# Patient Record
Sex: Male | Born: 1985 | Race: Black or African American | Hispanic: No | Marital: Single | Smoking: Never smoker
Health system: Southern US, Community
[De-identification: ages and names within clinical notes are randomized; demographics above are authoritative.]

---
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
## 2004-09-17 ENCOUNTER — Emergency Department (HOSPITAL_COMMUNITY): Admission: EM | Admit: 2004-09-17 | Discharge: 2004-09-17 | Payer: Self-pay | Admitting: Family Medicine

## 2005-05-17 ENCOUNTER — Emergency Department (HOSPITAL_COMMUNITY): Admission: EM | Admit: 2005-05-17 | Discharge: 2005-05-18 | Payer: Self-pay | Admitting: Emergency Medicine

## 2005-05-18 ENCOUNTER — Ambulatory Visit (HOSPITAL_COMMUNITY): Admission: RE | Admit: 2005-05-18 | Discharge: 2005-05-18 | Payer: Self-pay | Admitting: Orthopaedic Surgery

## 2006-03-29 ENCOUNTER — Emergency Department (HOSPITAL_COMMUNITY): Admission: EM | Admit: 2006-03-29 | Discharge: 2006-03-29 | Payer: Self-pay | Admitting: Emergency Medicine

## 2006-04-01 ENCOUNTER — Emergency Department (HOSPITAL_COMMUNITY): Admission: EM | Admit: 2006-04-01 | Discharge: 2006-04-01 | Payer: Self-pay | Admitting: Emergency Medicine

## 2007-03-17 ENCOUNTER — Emergency Department (HOSPITAL_COMMUNITY): Admission: AC | Admit: 2007-03-17 | Discharge: 2007-03-17 | Payer: Self-pay

## 2008-02-21 ENCOUNTER — Ambulatory Visit: Payer: Self-pay | Admitting: Diagnostic Radiology

## 2008-02-21 ENCOUNTER — Emergency Department (HOSPITAL_BASED_OUTPATIENT_CLINIC_OR_DEPARTMENT_OTHER): Admission: EM | Admit: 2008-02-21 | Discharge: 2008-02-21 | Payer: Self-pay | Admitting: Emergency Medicine

## 2008-02-23 ENCOUNTER — Emergency Department (HOSPITAL_COMMUNITY): Admission: EM | Admit: 2008-02-23 | Discharge: 2008-02-23 | Payer: Self-pay | Admitting: Emergency Medicine
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
## 2010-11-05 LAB — I-STAT 8, (EC8 V) (CONVERTED LAB)
Acid-base deficit: 1
Bicarbonate: 22.9
HCT: 50
Hemoglobin: 17
Operator id: 294501
Potassium: 3.8
Sodium: 139
TCO2: 24

## 2010-11-05 LAB — TYPE AND SCREEN

## 2010-11-05 LAB — ABO/RH: ABO/RH(D): B NEG

## 2011-06-21 ENCOUNTER — Telehealth: Payer: Self-pay

## 2011-06-21 NOTE — Telephone Encounter (Signed)
Wrong pt

## 2012-04-18 ENCOUNTER — Emergency Department (HOSPITAL_COMMUNITY)
Admission: EM | Admit: 2012-04-18 | Discharge: 2012-04-18 | Disposition: A | Discharge status: Home / Self Care | Payer: Self-pay | Attending: Emergency Medicine | Admitting: Emergency Medicine

## 2012-04-18 DIAGNOSIS — H9319 Tinnitus, unspecified ear: Secondary | ICD-10-CM | POA: Insufficient documentation

## 2012-04-18 DIAGNOSIS — H6121 Impacted cerumen, right ear: Secondary | ICD-10-CM

## 2012-04-18 DIAGNOSIS — M542 Cervicalgia: Secondary | ICD-10-CM | POA: Insufficient documentation

## 2012-04-18 DIAGNOSIS — R51 Headache: Secondary | ICD-10-CM | POA: Insufficient documentation

## 2012-04-18 DIAGNOSIS — H6120 Impacted cerumen, unspecified ear: Secondary | ICD-10-CM | POA: Insufficient documentation

## 2012-04-18 NOTE — ED Provider Notes (Signed)
History  This chart was scribed for non-physician practitioner working with Flint Melter, MD by Ardeen Jourdain, ED Scribe. This patient was seen in room WTR6/WTR6 and the patient's care was started at 2130.  CSN: [id number]  Arrival date & time 04/18/12  2115   First MD Initiated Contact with Patient 04/18/12 2130      Chief Complaint  Patient presents with  . Otalgia    Patient is a 27 y.o. male presenting with plugged ear sensation. The history is provided by the patient. No language interpreter was used.  Ear Fullness This is a new problem. The current episode started 2 days ago. The problem occurs constantly. The problem has been gradually worsening. Pertinent negatives include no chest pain, no abdominal pain, no headaches and no shortness of breath. Nothing aggravates the symptoms. Nothing relieves the symptoms. He has tried water (Ear wax drops) for the symptoms. The treatment provided mild relief.    BO TEICHER is a 27 y.o. male who presents to the Emergency Department complaining of gradually worsening right ear fullness that began 2 days ago. He has associated tinnitus, HA, neck pain and muffled hearing. He states the pain begins in his ear and will radiate down his neck.  He states it feels like there is something in his ear. He describes the feeling as pressure more than pain. He reports using OTC ear flush, flushed with water and used q-tips with no relief. He denies any sore throat, nausea, emesis, fever and chills as associated symptoms. He states he has had similar symptoms in the past.  No past medical history on file.  No past surgical history on file.  No family history on file.  History  Substance Use Topics  . Smoking status: Not on file  . Smokeless tobacco: Not on file  . Alcohol Use: Not on file      Review of Systems  Constitutional: Negative for fever.  HENT: Positive for ear pain. Negative for hearing loss, congestion and rhinorrhea.   Respiratory:  Negative for shortness of breath.   Cardiovascular: Negative for chest pain.  Gastrointestinal: Negative for nausea, vomiting, abdominal pain and diarrhea.  Neurological: Negative for headaches.  All other systems reviewed and are negative.    Allergies  Review of patient's allergies indicates not on file.  Home Medications   Current Outpatient Rx  Name  Route  Sig  Dispense  Refill  . Multiple Vitamin (MULTIVITAMIN WITH MINERALS) TABS   Oral   Take 1 tablet by mouth daily.         Marland Kitchen OVER THE COUNTER MEDICATION   Oral   Take 1 packet by mouth 4 (four) times a week. Nitric oxide supplement.           Triage Vitals: BP 141/84  Pulse 81  Temp(Src) 98.7 F (37.1 C) (Oral)  Resp 20  Wt 185 lb (83.915 kg)  SpO2 100%  Physical Exam  Nursing note and vitals reviewed. Constitutional: He is oriented to person, place, and time. He appears well-developed and well-nourished. No distress.  HENT:  Head: Normocephalic and atraumatic.  Right Ear: External ear normal.  Left Ear: External ear normal.  Bilateral TM not visible, total cerumen impaction bilaterally  Eyes: Conjunctivae and EOM are normal. Pupils are equal, round, and reactive to light.  Neck: Normal range of motion. Neck supple. No tracheal deviation present.  Cardiovascular: Normal rate.   Pulmonary/Chest: Effort normal. No stridor. No respiratory distress.  Abdominal: Soft. He exhibits  no distension.  Musculoskeletal: Normal range of motion. He exhibits no edema.  Neurological: He is alert and oriented to person, place, and time.  Skin: Skin is warm and dry. He is not diaphoretic.  Psychiatric: He has a normal mood and affect. His behavior is normal.    ED Course  Procedures (including critical care time)  DIAGNOSTIC STUDIES: Oxygen Saturation is 100% on room air, normal by my interpretation.    COORDINATION OF CARE:  9:46 PM: Discussed treatment plan with pt at bedside and pt agreed to plan.     Labs  Reviewed - No data to display No results found.  Patient reassessed after right ear was flushed and large amount of cerumen was removed. He states he feels significantly better. TM clear, light reflex present.   1. Cerumen impaction, right       MDM  Vitals are stable. Patient showed significant improvement after cerumen was removed. No concern for otitis media, otitis externa. Return precautions given. Instructions for home cerumen removal given. Counseled on not using qtips or anything smaller than his elbow in his ear.     I personally performed the services described in this documentation, which was scribed in my presence. The recorded information has been reviewed and is accurate.  Mora Bellman, PA-C 04/18/12 2319

## 2012-04-18 NOTE — ED Notes (Signed)
Patient with right ear pain and discomfort for two days.  Denies fever chills, nausea, or vomiting.  Denies sore throat or cold symptoms.  Patient said ear felt initially stopped up so he used some over the counter flush, then ear became painful early this morning.

## 2012-04-18 NOTE — ED Provider Notes (Signed)
Medical screening examination/treatment/procedure(s) were performed by non-physician practitioner and as supervising physician I was immediately available for consultation/collaboration.   Alvis Edgell L Itzel Mckibbin, MD 04/18/12 2353 

## 2019-09-18 ENCOUNTER — Emergency Department (HOSPITAL_COMMUNITY)
Admission: EM | Admit: 2019-09-18 | Discharge: 2019-09-18 | Disposition: A | Discharge status: Home / Self Care | Payer: Managed Care, Other (non HMO) | Attending: Emergency Medicine | Admitting: Emergency Medicine

## 2019-09-18 ENCOUNTER — Encounter (HOSPITAL_COMMUNITY): Payer: Self-pay | Admitting: Emergency Medicine

## 2019-09-18 ENCOUNTER — Other Ambulatory Visit: Payer: Self-pay

## 2019-09-18 ENCOUNTER — Emergency Department (HOSPITAL_COMMUNITY): Payer: Managed Care, Other (non HMO)

## 2019-09-18 DIAGNOSIS — Y929 Unspecified place or not applicable: Secondary | ICD-10-CM | POA: Diagnosis not present

## 2019-09-18 DIAGNOSIS — S80912A Unspecified superficial injury of left knee, initial encounter: Secondary | ICD-10-CM | POA: Diagnosis not present

## 2019-09-18 DIAGNOSIS — Y999 Unspecified external cause status: Secondary | ICD-10-CM | POA: Diagnosis not present

## 2019-09-18 DIAGNOSIS — S8992XA Unspecified injury of left lower leg, initial encounter: Secondary | ICD-10-CM

## 2019-09-18 DIAGNOSIS — Y939 Activity, unspecified: Secondary | ICD-10-CM | POA: Insufficient documentation

## 2019-09-18 MED ORDER — OXYCODONE-ACETAMINOPHEN 5-325 MG PO TABS: 1.0000 | ORAL_TABLET | Freq: Four times a day (QID) | ORAL | 0 refills | Status: AC | PRN

## 2019-09-18 MED ORDER — OXYCODONE-ACETAMINOPHEN 5-325 MG PO TABS
1.0000 | ORAL_TABLET | Freq: Once | ORAL | Status: AC
  Administered 2019-09-18: 1 via ORAL
  Filled 2019-09-18: qty 1

## 2019-09-18 MED ORDER — IBUPROFEN 600 MG PO TABS: 600.0000 mg | ORAL_TABLET | Freq: Four times a day (QID) | ORAL | 0 refills | Status: AC | PRN

## 2019-09-18 MED ORDER — KETOROLAC TROMETHAMINE 60 MG/2ML IM SOLN
30.0000 mg | Freq: Once | INTRAMUSCULAR | Status: AC
  Administered 2019-09-18: 30 mg via INTRAMUSCULAR
  Filled 2019-09-18: qty 2

## 2019-09-18 NOTE — Progress Notes (Signed)
Orthopedic Tech Progress Note Patient Details:  Gerald Coleman 07-Mar-1985 [id number]  Ortho Devices Type of Ortho Device: Knee Immobilizer, Crutches Ortho Device/Splint Location: left Ortho Device/Splint Interventions: Application   Post Interventions Patient Tolerated: Well Instructions Provided: Care of device   Saul Fordyce 09/18/2019, 9:53 AM

## 2019-09-18 NOTE — ED Notes (Signed)
Pt discharged from this ED in stable condition at this time. All discharge instructions and follow up care reviewed with pt with no further questions at this time. Pt ambulatory with crutches, clear speech.  

## 2019-09-18 NOTE — Discharge Instructions (Addendum)
Please read instructions below. Apply ice to your knee for 20 minutes at a time. Elevate your leg as much as possible to help with swelling. You can take oxycodone every 6 hours as needed for severe pain.  Be aware there is Tylenol in this medication.  It can also make you drowsy, do not drive or drink alcohol while taking it. Please take ibuprofen, 600 mg, every 6 hours to help with pain and swelling.  You can take the oxycodone in addition to this medication if needed. Schedule an appointment with the orthopedic specialist in 1 to 2 weeks for follow-up on your injury. Keep the knee brace on as much as possible, especially when you are mobile. Return to the ER for new or concerning symptoms.
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
## 2019-09-18 NOTE — ED Triage Notes (Signed)
Pt reports having left knee pain that occurred at 0300 and felt knee pop out and was then able to pop it back in place.

## 2020-12-15 ENCOUNTER — Ambulatory Visit
Admission: EM | Admit: 2020-12-15 | Discharge: 2020-12-15 | Disposition: A | Discharge status: Home / Self Care | Payer: Managed Care, Other (non HMO) | Attending: Internal Medicine | Admitting: Internal Medicine

## 2020-12-15 ENCOUNTER — Other Ambulatory Visit: Payer: Self-pay

## 2020-12-15 ENCOUNTER — Encounter: Payer: Self-pay | Admitting: Emergency Medicine

## 2020-12-15 DIAGNOSIS — J101 Influenza due to other identified influenza virus with other respiratory manifestations: Secondary | ICD-10-CM | POA: Diagnosis not present

## 2020-12-15 DIAGNOSIS — R509 Fever, unspecified: Secondary | ICD-10-CM

## 2020-12-15 LAB — POCT INFLUENZA A/B
Influenza A, POC: POSITIVE — AB
Influenza B, POC: NEGATIVE

## 2020-12-15 MED ORDER — ACETAMINOPHEN 325 MG PO TABS
650.0000 mg | ORAL_TABLET | Freq: Once | ORAL | Status: AC
  Administered 2020-12-15: 650 mg via ORAL

## 2020-12-15 MED ORDER — OSELTAMIVIR PHOSPHATE 75 MG PO CAPS: 75.0000 mg | ORAL_CAPSULE | Freq: Two times a day (BID) | ORAL | 0 refills | Status: AC

## 2020-12-15 NOTE — ED Provider Notes (Signed)
[facility]    CSN: [id number] Arrival date & time: 12/15/20  1209      History   Chief Complaint Chief Complaint  Patient presents with   Cough   Generalized Body Aches    HPI Gerald Coleman is a 35 y.o. male.   Patient presents with nonproductive cough, generalized body aches, fever that started yesterday.  Denies any upper respiratory symptoms, sore throat, ear pain.  Denies any known sick contacts.  Denies chest pain or shortness of breath.  Denies nausea, vomiting, diarrhea, abdominal pain.   Cough  History reviewed. No pertinent past medical history.  There are no problems to display for this patient.   History reviewed. No pertinent surgical history.     Home Medications    Prior to Admission medications   Medication Sig Start Date End Date Taking? Authorizing Provider  oseltamivir (TAMIFLU) 75 MG capsule Take 1 capsule (75 mg total) by mouth every 12 (twelve) hours. 12/15/20  Yes Magdalen Cabana, Rolly Salter E, FNP  ibuprofen (ADVIL) 600 MG tablet Take 1 tablet (600 mg total) by mouth every 6 (six) hours as needed. 09/18/19   Robinson, [place] N, PA-C  Multiple Vitamin (MULTIVITAMIN WITH MINERALS) TABS Take 1 tablet by mouth daily.    [provider]  OVER THE COUNTER MEDICATION Take 1 packet by mouth 4 (four) times a week. Nitric oxide supplement.    [provider]  oxyCODONE-acetaminophen (PERCOCET/ROXICET) 5-325 MG tablet Take 1 tablet by mouth every 6 (six) hours as needed for severe pain. 09/18/19   Robinson, [place] N, PA-C    Family History No family history on file.  Social History Social History   Tobacco Use   Smoking status: Never   Smokeless tobacco: Never  Substance Use Topics   Alcohol use: Never   Drug use: Never     Allergies   Patient has no known allergies.   Review of Systems Review of Systems Per HPI  Physical Exam Triage Vital Signs ED Triage Vitals [12/15/20 1334]  Enc Vitals Group     BP 129/82     Pulse  Rate 79     Resp 16     Temp (!) 100.4 F (38 C)     Temp Source Oral     SpO2 98 %     Weight      Height      Head Circumference      Peak Flow      Pain Score 7     Pain Loc      Pain Edu?      Excl. in GC?    No data found.  Updated Vital Signs BP 129/82 (BP Location: Left Arm)   Pulse 79   Temp (!) 100.4 F (38 C) (Oral)   Resp 16   SpO2 98%   Visual Acuity Right Eye Distance:   Left Eye Distance:   Bilateral Distance:    Right Eye Near:   Left Eye Near:    Bilateral Near:     Physical Exam Constitutional:      General: He is not in acute distress.    Appearance: Normal appearance. He is not toxic-appearing or diaphoretic.  HENT:     Head: Normocephalic and atraumatic.     Right Ear: Tympanic membrane and ear canal normal.     Left Ear: Tympanic membrane and ear canal normal.     Nose: Congestion present.     Mouth/Throat:     Mouth:  Mucous membranes are moist.     Pharynx: No posterior oropharyngeal erythema.  Eyes:     Extraocular Movements: Extraocular movements intact.     Conjunctiva/sclera: Conjunctivae normal.     Pupils: Pupils are equal, round, and reactive to light.  Cardiovascular:     Rate and Rhythm: Normal rate and regular rhythm.     Pulses: Normal pulses.     Heart sounds: Normal heart sounds.  Pulmonary:     Effort: Pulmonary effort is normal. No respiratory distress.     Breath sounds: Normal breath sounds. No stridor. No wheezing, rhonchi or rales.  Abdominal:     General: Abdomen is flat. Bowel sounds are normal.     Palpations: Abdomen is soft.  Musculoskeletal:        General: Normal range of motion.     Cervical back: Normal range of motion.  Skin:    General: Skin is warm and dry.  Neurological:     General: No focal deficit present.     Mental Status: He is alert and oriented to person, place, and time. Mental status is at baseline.  Psychiatric:        Mood and Affect: Mood normal.        Behavior: Behavior normal.      UC Treatments / Results  Labs (all labs ordered are listed, but only abnormal results are displayed) Labs Reviewed  POCT INFLUENZA A/B - Abnormal; Notable for the following components:      Result Value   Influenza A, POC Positive (*)    All other components within normal limits    EKG   Radiology No results found.  Procedures Procedures (including critical care time)  Medications Ordered in UC Medications  acetaminophen (TYLENOL) tablet 650 mg (650 mg Oral Given 12/15/20 1338)    Initial Impression / Assessment and Plan / UC Course  I have reviewed the triage vital signs and the nursing notes.  Pertinent labs & imaging results that were available during my care of the patient were reviewed by me and considered in my medical decision making (see chart for details).     Will treat influenza A with Tamiflu x5 days.  Discussed supportive care and symptom management with patient.  Fever monitoring and management discussed with patient.  Red flags seen on exam.  Discussed strict return precautions.  Patient verbalized understanding and is agreeable with plan. Final Clinical Impressions(s) / UC Diagnoses   Final diagnoses:  Influenza A  Fever, unspecified     Discharge Instructions      You have tested positive for influenza A.  This is being treated with Tamiflu.  Please continue to monitor for fevers and treat as appropriate with Tylenol.     ED Prescriptions     Medication Sig Dispense Auth. Provider   oseltamivir (TAMIFLU) 75 MG capsule Take 1 capsule (75 mg total) by mouth every 12 (twelve) hours. 10 capsule Gustavus Bryant, [place]      PDMP not reviewed this encounter.   Gustavus Bryant, [place] 12/15/20 [phone]

## 2020-12-15 NOTE — ED Triage Notes (Signed)
Cough, generalized body aches, fever starting yesterday. Negative at home covid test

## 2020-12-15 NOTE — Discharge Instructions (Signed)
You have tested positive for influenza A.  This is being treated with Tamiflu.  Please continue to monitor for fevers and treat as appropriate with Tylenol.

## 2021-04-03 IMAGING — CR DG KNEE COMPLETE 4+V*L*
4 series · 4 of 4 positions shown · non-contrast
Comparison: None.

CLINICAL DATA: Increasing pain since knee injury 09/17/2019

EXAM:
LEFT KNEE - COMPLETE 4+ VIEW

[t knee obl left (1 of 3)]
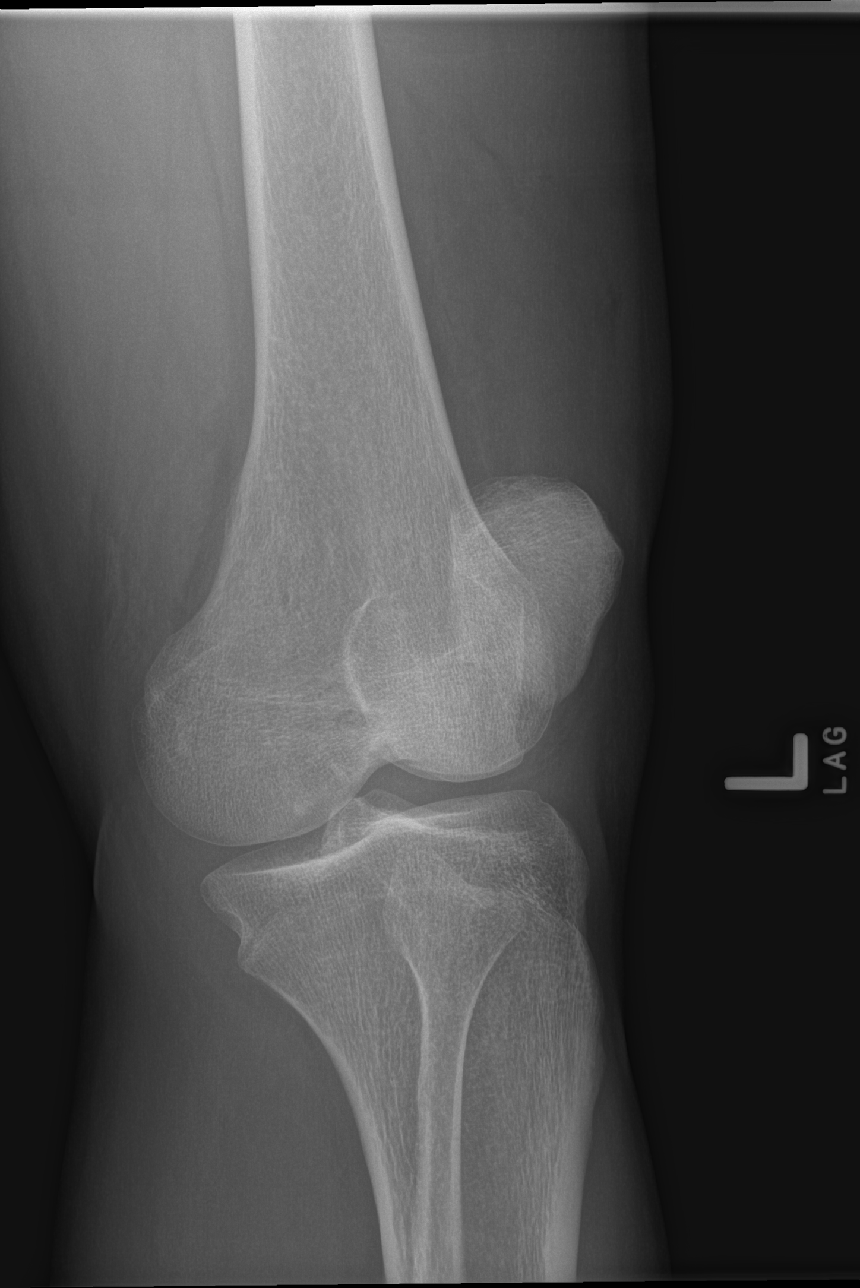

[t knee obl left (2 of 3)]
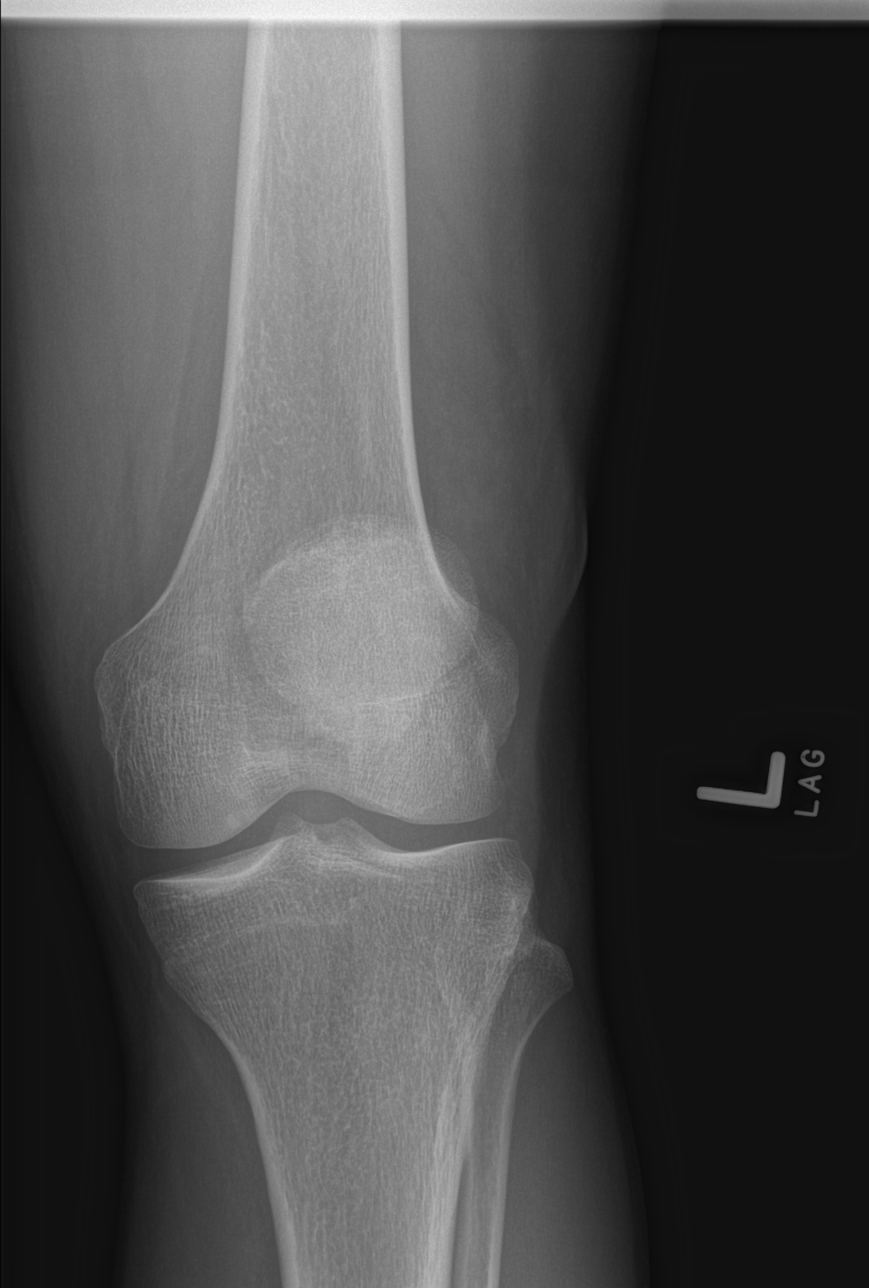

[t knee obl left (3 of 3)]
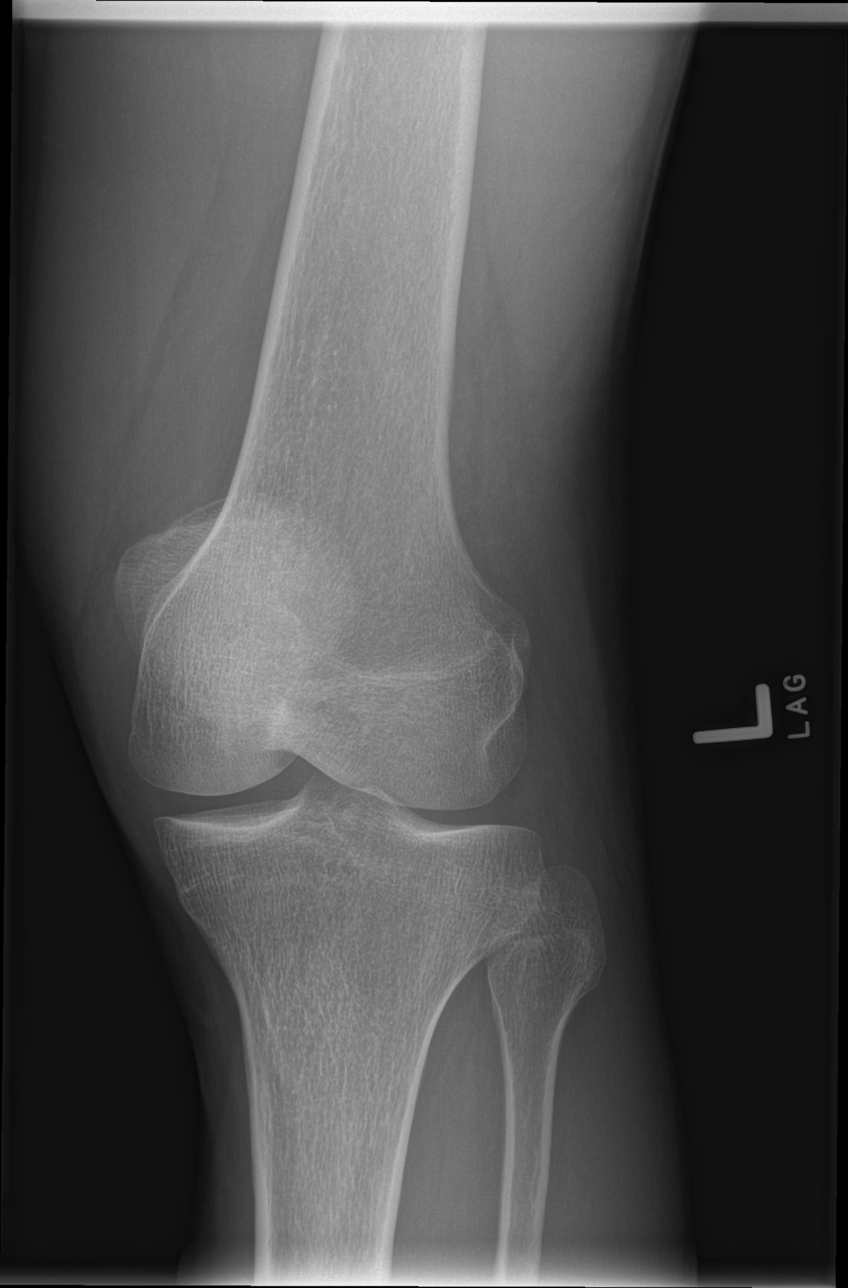

[t knee lat left]
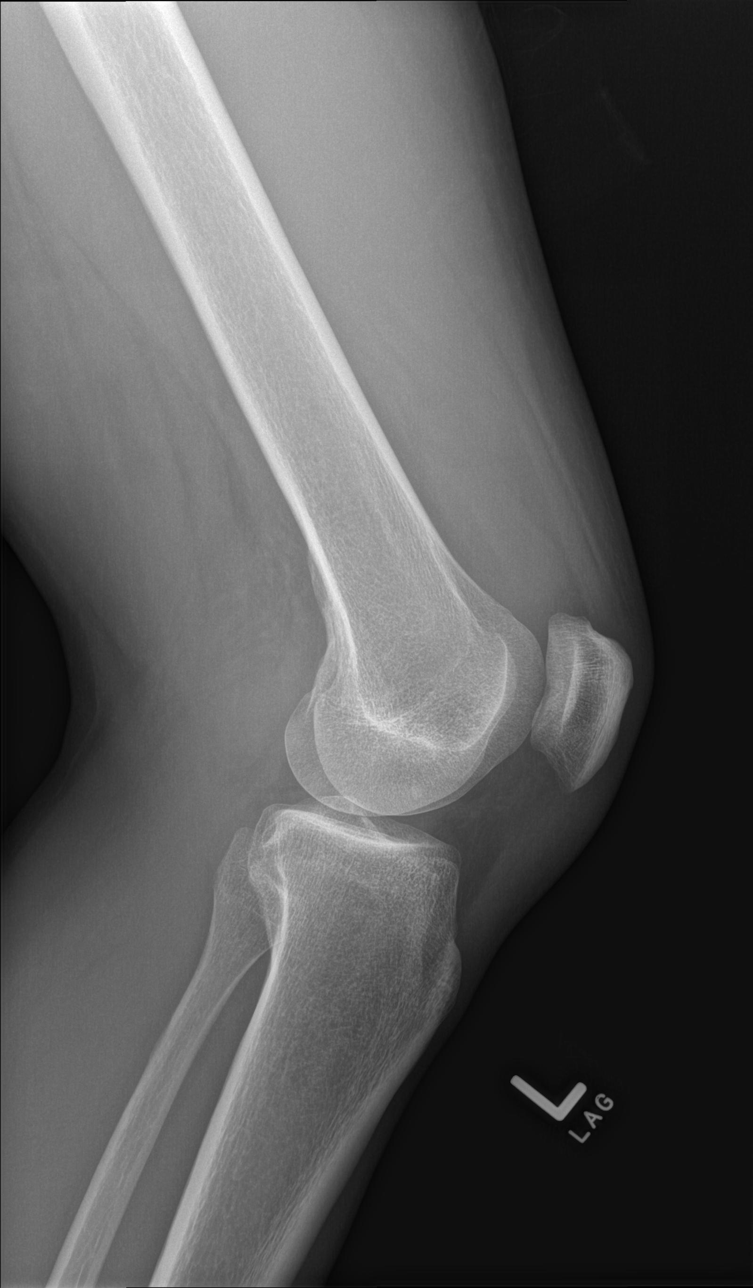

[4 of 4 positions shown; findings below may reference images not displayed]

FINDINGS: Joint effusion without fracture or subluxation. No degenerative
changes.
IMPRESSION: Joint effusion without fracture.
